# Patient Record
Sex: Female | Born: 1980 | Race: Black or African American | Hispanic: No | Marital: Single | Smoking: Current every day smoker
Health system: Southern US, Community
[De-identification: ages and names within clinical notes are randomized; demographics above are authoritative.]

---

## 2017-05-17 ENCOUNTER — Encounter: Payer: Self-pay | Admitting: Emergency Medicine

## 2017-05-17 ENCOUNTER — Other Ambulatory Visit: Payer: Self-pay

## 2017-05-17 ENCOUNTER — Emergency Department
Admission: EM | Admit: 2017-05-17 | Discharge: 2017-05-17 | Discharge status: Against Medical Advice | Payer: Self-pay | Attending: Emergency Medicine | Admitting: Emergency Medicine

## 2017-05-17 DIAGNOSIS — N898 Other specified noninflammatory disorders of vagina: Secondary | ICD-10-CM | POA: Insufficient documentation

## 2017-05-17 DIAGNOSIS — R350 Frequency of micturition: Secondary | ICD-10-CM | POA: Insufficient documentation

## 2017-05-17 DIAGNOSIS — Z5321 Procedure and treatment not carried out due to patient leaving prior to being seen by health care provider: Secondary | ICD-10-CM | POA: Insufficient documentation

## 2017-05-17 DIAGNOSIS — R1084 Generalized abdominal pain: Secondary | ICD-10-CM | POA: Insufficient documentation

## 2017-05-17 LAB — CBC
HCT: 35.5 % (ref 35.0–47.0)
HEMOGLOBIN: 11.2 g/dL — AB (ref 12.0–16.0)
MCH: 24.5 pg — ABNORMAL LOW (ref 26.0–34.0)
MCHC: 31.6 g/dL — ABNORMAL LOW (ref 32.0–36.0)
MCV: 77.6 fL — ABNORMAL LOW (ref 80.0–100.0)
PLATELETS: 280 10*3/uL (ref 150–440)
RBC: 4.58 MIL/uL (ref 3.80–5.20)
RDW: 17.2 % — ABNORMAL HIGH (ref 11.5–14.5)
WBC: 8.5 10*3/uL (ref 3.6–11.0)

## 2017-05-17 LAB — URINALYSIS, COMPLETE (UACMP) WITH MICROSCOPIC
Bacteria, UA: NONE SEEN
Bilirubin Urine: NEGATIVE
GLUCOSE, UA: NEGATIVE mg/dL
KETONES UR: NEGATIVE mg/dL
LEUKOCYTES UA: NEGATIVE
NITRITE: NEGATIVE
PH: 5 (ref 5.0–8.0)
Protein, ur: NEGATIVE mg/dL
Specific Gravity, Urine: 1.027 (ref 1.005–1.030)

## 2017-05-17 LAB — COMPREHENSIVE METABOLIC PANEL
ALK PHOS: 50 U/L (ref 38–126)
ALT: 18 U/L (ref 14–54)
AST: 33 U/L (ref 15–41)
Albumin: 3.9 g/dL (ref 3.5–5.0)
Anion gap: 5 (ref 5–15)
BUN: 12 mg/dL (ref 6–20)
CALCIUM: 8.8 mg/dL — AB (ref 8.9–10.3)
CO2: 21 mmol/L — ABNORMAL LOW (ref 22–32)
CREATININE: 0.73 mg/dL (ref 0.44–1.00)
Chloride: 109 mmol/L (ref 101–111)
Glucose, Bld: 90 mg/dL (ref 65–99)
Potassium: 4 mmol/L (ref 3.5–5.1)
Sodium: 135 mmol/L (ref 135–145)
TOTAL PROTEIN: 6.9 g/dL (ref 6.5–8.1)
Total Bilirubin: 0.5 mg/dL (ref 0.3–1.2)

## 2017-05-17 LAB — POC URINE PREG, ED: Preg Test, Ur: NEGATIVE

## 2017-05-17 LAB — LIPASE, BLOOD: Lipase: 25 U/L (ref 11–51)

## 2017-05-17 NOTE — ED Triage Notes (Signed)
Pt to ED c/o abd pain generalized sharp pain, urinary frequency, discharge, denies vaginal bleeding, denies n/v/d.

## 2017-05-17 NOTE — ED Triage Notes (Deleted)
Pt presents to ED with c/o high blood pressure. Pt states went to a dental clinic in [place] and had high blood pressure. Pt states had a history of high blood pressure a long time ago but changed his diet and problem resolved. 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 Pt c/o feeling anxious at this time.

## 2017-05-29 ENCOUNTER — Other Ambulatory Visit: Payer: Self-pay

## 2017-05-29 ENCOUNTER — Encounter: Payer: Self-pay | Admitting: Emergency Medicine

## 2017-05-29 ENCOUNTER — Emergency Department
Admission: EM | Admit: 2017-05-29 | Discharge: 2017-05-29 | Disposition: A | Discharge status: Home / Self Care | Payer: Self-pay | Attending: Emergency Medicine | Admitting: Emergency Medicine

## 2017-05-29 DIAGNOSIS — N3090 Cystitis, unspecified without hematuria: Secondary | ICD-10-CM | POA: Insufficient documentation

## 2017-05-29 DIAGNOSIS — F17210 Nicotine dependence, cigarettes, uncomplicated: Secondary | ICD-10-CM | POA: Insufficient documentation

## 2017-05-29 LAB — BASIC METABOLIC PANEL
Anion gap: 7 (ref 5–15)
BUN: 13 mg/dL (ref 6–20)
CALCIUM: 8.7 mg/dL — AB (ref 8.9–10.3)
CO2: 24 mmol/L (ref 22–32)
CREATININE: 0.63 mg/dL (ref 0.44–1.00)
Chloride: 105 mmol/L (ref 101–111)
GFR calc Af Amer: >60 mL/min (ref >60)
GFR calc non Af Amer: >60 mL/min (ref >60)
Glucose, Bld: 94 mg/dL (ref 65–99)
Potassium: 3.7 mmol/L (ref 3.5–5.1)
SODIUM: 136 mmol/L (ref 135–145)

## 2017-05-29 LAB — URINALYSIS, COMPLETE (UACMP) WITH MICROSCOPIC
BILIRUBIN URINE: NEGATIVE
Bacteria, UA: NONE SEEN
GLUCOSE, UA: NEGATIVE mg/dL
Hgb urine dipstick: NEGATIVE
Ketones, ur: NEGATIVE mg/dL
Nitrite: NEGATIVE
Protein, ur: NEGATIVE mg/dL
Specific Gravity, Urine: 1.021 (ref 1.005–1.030)
pH: 6 (ref 5.0–8.0)

## 2017-05-29 LAB — CBC WITH DIFFERENTIAL/PLATELET
BASOS PCT: 1 %
Basophils Absolute: 0 10*3/uL (ref 0–0.1)
EOS ABS: 0.2 10*3/uL (ref 0–0.7)
Eosinophils Relative: 3 %
HEMATOCRIT: 32 % — AB (ref 35.0–47.0)
Hemoglobin: 10.4 g/dL — ABNORMAL LOW (ref 12.0–16.0)
Lymphocytes Relative: 41 %
Lymphs Abs: 2.9 10*3/uL (ref 1.0–3.6)
MCH: 25.1 pg — ABNORMAL LOW (ref 26.0–34.0)
MCHC: 32.7 g/dL (ref 32.0–36.0)
MCV: 76.7 fL — ABNORMAL LOW (ref 80.0–100.0)
MONO ABS: 0.4 10*3/uL (ref 0.2–0.9)
MONOS PCT: 6 %
Neutro Abs: 3.5 10*3/uL (ref 1.4–6.5)
Neutrophils Relative %: 49 %
Platelets: 295 10*3/uL (ref 150–440)
RBC: 4.17 MIL/uL (ref 3.80–5.20)
RDW: 16.3 % — AB (ref 11.5–14.5)
WBC: 7.2 10*3/uL (ref 3.6–11.0)

## 2017-05-29 LAB — POCT PREGNANCY, URINE: Preg Test, Ur: NEGATIVE

## 2017-05-29 MED ORDER — CEPHALEXIN 500 MG PO CAPS: 500.0000 mg | ORAL_CAPSULE | Freq: Two times a day (BID) | ORAL | 0 refills | Status: AC

## 2017-05-29 MED ORDER — CEPHALEXIN 500 MG PO CAPS
500.0000 mg | ORAL_CAPSULE | Freq: Once | ORAL | Status: AC
  Administered 2017-05-29: 500 mg via ORAL
  Filled 2017-05-29: qty 1

## 2017-05-29 NOTE — ED Provider Notes (Signed)
[facility] Emergency Department Provider Note  ____________________________________________  Time seen: Approximately 9:00 PM  I have reviewed the triage vital signs and the nursing notes.   HISTORY  Chief Complaint Abdominal Pain    HPI Cheryl Pratt is a 37 y.o. female who complains of suprapubic abdominal pain that is sharp radiating to the lower back since yesterday. Associated with dysuria and urinary frequency. Denies any abnormal vaginal bleeding or discharge. No fevers chills vomiting diarrhea or constipation. Eating and drinking normally. No alleviating factors to the abdominal pain which is sharp.     History reviewed. No pertinent past medical history. PID  There are no active problems to display for this patient.    History reviewed. No pertinent surgical history.   Prior to Admission medications   Medication Sig Start Date End Date Taking? Authorizing Provider  cephALEXin (KEFLEX) 500 MG capsule Take 1 capsule (500 mg total) by mouth 2 (two) times daily. 05/29/17   Sharman CheekStafford, Peggie Hornak, MD     Allergies Patient has no known allergies.   No family history on file.  Social History Social History   Tobacco Use  . Smoking status: Current Every Day Smoker    Packs/day: 0.25    Types: Cigarettes  . Smokeless tobacco: Never Used  Substance Use Topics  . Alcohol use: Yes    Frequency: Never    Comment: couple times a month  . Drug use: No    Review of Systems  Constitutional:   No fever or chills.  ENT:   No sore throat. No rhinorrhea. Cardiovascular:   No chest pain or syncope. Respiratory:   No dyspnea or cough. Gastrointestinal:  Suprapubic abdominal pain as above..  Musculoskeletal:   Negative for focal pain or swelling All other systems reviewed and are negative except as documented above in ROS and HPI.  ____________________________________________   PHYSICAL EXAM:  VITAL SIGNS: ED Triage Vitals [05/29/17  1943]  Enc Vitals Group     BP 134/80     Pulse Rate 96     Resp 20     Temp 98.2 F (36.8 C)     Temp Source Oral     SpO2 100 %     Weight 200 lb (90.7 kg)     Height 5\' 5"  (1.651 m)     Head Circumference      Peak Flow      Pain Score 10     Pain Loc      Pain Edu?      Excl. in GC?     Vital signs reviewed, nursing assessments reviewed.   Constitutional:   Alert and oriented. Well appearing and in no distress. Eyes:   No scleral icterus.  EOMI. No nystagmus. No conjunctival pallor. PERRL. ENT   Head:   Normocephalic and atraumatic.   Nose:   No congestion/rhinnorhea.    Mouth/Throat:   MMM, no pharyngeal erythema. No peritonsillar mass.    Neck:   No meningismus. Full ROM. Hematological/Lymphatic/Immunilogical:   No cervical lymphadenopathy. Cardiovascular:   RRR. Symmetric bilateral radial and DP pulses.  No murmurs.  Respiratory:   Normal respiratory effort without tachypnea/retractions. Breath sounds are clear and equal bilaterally. No wheezes/rales/rhonchi. Gastrointestinal:   Soft with suprapubic tenderness. Non distended. There is no CVA tenderness.  No rebound, rigidity, or guarding. Genitourinary:   deferred Musculoskeletal:   Normal range of motion in all extremities. No joint effusions.  No lower extremity tenderness.  No edema. Neurologic:  Normal speech and language.  Motor grossly intact. No acute focal neurologic deficits are appreciated.  Skin:    Skin is warm, dry and intact. No rash noted.  No petechiae, purpura, or bullae.  ____________________________________________    LABS (pertinent positives/negatives) (all labs ordered are listed, but only abnormal results are displayed) Labs Reviewed  URINALYSIS, COMPLETE (UACMP) WITH MICROSCOPIC - Abnormal; Notable for the following components:      Result Value   Color, Urine YELLOW (*)    APPearance HAZY (*)    Leukocytes, UA TRACE (*)    Squamous Epithelial / LPF 6-30 (*)    All other  components within normal limits  CBC WITH DIFFERENTIAL/PLATELET - Abnormal; Notable for the following components:   Hemoglobin 10.4 (*)    HCT 32.0 (*)    MCV 76.7 (*)    MCH 25.1 (*)    RDW 16.3 (*)    All other components within normal limits  BASIC METABOLIC PANEL - Abnormal; Notable for the following components:   Calcium 8.7 (*)    All other components within normal limits  POCT PREGNANCY, URINE   ____________________________________________   EKG    ____________________________________________    RADIOLOGY  No results found.  ____________________________________________   PROCEDURES Procedures  ____________________________________________    CLINICAL IMPRESSION / ASSESSMENT AND PLAN / ED COURSE  Pertinent labs & imaging results that were available during my care of the patient were reviewed by me and considered in my medical decision making (see chart for details).   Patient well-appearing no acute distress, presents with normal vital signs and exam consistent with urinary tract infection. Labs all unremarkable except for urinalysis which is also consistent with urinary tract infection. I'll treat her with Keflex and have her follow up with primary care.Considering the patient's symptoms, medical history, and physical examination today, I have low suspicion for cholecystitis or biliary pathology, pancreatitis, perforation or bowel obstruction, hernia, intra-abdominal abscess, AAA or dissection, volvulus or intussusception, mesenteric ischemia, or appendicitis.        ____________________________________________   FINAL CLINICAL IMPRESSION(S) / ED DIAGNOSES    Final diagnoses:  Cystitis       Portions of this note were generated with dragon dictation software. Dictation errors may occur despite best attempts at proofreading.    Sharman Cheek, MD 05/29/17 2103

## 2017-05-29 NOTE — ED Triage Notes (Signed)
Patient ambulatory to triage with steady gait, without difficulty or distress noted; pt reports dysuria, lower abd & back pain since last wk; st was here last week but left before being seen due to long wait

## 2017-06-21 ENCOUNTER — Emergency Department
Admission: EM | Admit: 2017-06-21 | Discharge: 2017-06-21 | Disposition: A | Discharge status: Home / Self Care | Payer: Self-pay | Attending: Emergency Medicine | Admitting: Emergency Medicine

## 2017-06-21 ENCOUNTER — Other Ambulatory Visit: Payer: Self-pay

## 2017-06-21 ENCOUNTER — Encounter: Payer: Self-pay | Admitting: Emergency Medicine

## 2017-06-21 DIAGNOSIS — B9689 Other specified bacterial agents as the cause of diseases classified elsewhere: Secondary | ICD-10-CM

## 2017-06-21 DIAGNOSIS — N760 Acute vaginitis: Secondary | ICD-10-CM | POA: Insufficient documentation

## 2017-06-21 DIAGNOSIS — N3000 Acute cystitis without hematuria: Secondary | ICD-10-CM | POA: Insufficient documentation

## 2017-06-21 DIAGNOSIS — F17210 Nicotine dependence, cigarettes, uncomplicated: Secondary | ICD-10-CM | POA: Insufficient documentation

## 2017-06-21 LAB — CHLAMYDIA/NGC RT PCR (ARMC ONLY)
CHLAMYDIA TR: NOT DETECTED
N GONORRHOEAE: NOT DETECTED

## 2017-06-21 LAB — WET PREP, GENITAL
Sperm: NONE SEEN
TRICH WET PREP: NONE SEEN
Yeast Wet Prep HPF POC: NONE SEEN

## 2017-06-21 LAB — URINALYSIS, COMPLETE (UACMP) WITH MICROSCOPIC
Bilirubin Urine: NEGATIVE
GLUCOSE, UA: NEGATIVE mg/dL
Hgb urine dipstick: NEGATIVE
Ketones, ur: NEGATIVE mg/dL
Nitrite: NEGATIVE
PROTEIN: NEGATIVE mg/dL
Specific Gravity, Urine: 1.012 (ref 1.005–1.030)
pH: 6 (ref 5.0–8.0)

## 2017-06-21 LAB — POCT PREGNANCY, URINE: Preg Test, Ur: NEGATIVE

## 2017-06-21 MED ORDER — SULFAMETHOXAZOLE-TRIMETHOPRIM 800-160 MG PO TABS: 1.0000 | ORAL_TABLET | Freq: Two times a day (BID) | ORAL | 0 refills | Status: AC

## 2017-06-21 MED ORDER — METRONIDAZOLE 500 MG PO TABS: 500.0000 mg | ORAL_TABLET | Freq: Two times a day (BID) | ORAL | 0 refills | Status: AC

## 2017-06-21 NOTE — ED Triage Notes (Signed)
Presents with possible UTI  Dysuria and freq  Was treated for same about 3 weeks ago    sxs' returned over the past couple of days  Also having some vaginal itching

## 2017-06-21 NOTE — ED Notes (Signed)
First Nurse Note:  Patient states, "I think I have a UTI"  Patient is in no obvious distress at this time.

## 2017-06-21 NOTE — ED Provider Notes (Signed)
[facility] Emergency Department Provider Note  ____________________________________________  Time seen: Approximately 4:50 PM  I have reviewed the triage vital signs and the nursing notes.   HISTORY  Chief Complaint No chief complaint on file.    HPI Cheryl Pratt is a 37 y.o. female that presents to the emergency department for evaluation of dysuria and urgency for 1 week.  Patient was treated for a UTI 3 weeks ago.  Symptoms improved but then returned last week.  She is also having some suprapubic tenderness.  She was on her menstrual cycle so she did not come into the emergency department.  After getting off of her menstrual cycle, she has noticed some vaginal itching.  She denies fever, chills, nausea, vomiting.   History reviewed. No pertinent past medical history.  There are no active problems to display for this patient.   History reviewed. No pertinent surgical history.  Prior to Admission medications   Medication Sig Start Date End Date Taking? Authorizing Provider  cephALEXin (KEFLEX) 500 MG capsule Take 1 capsule (500 mg total) by mouth 2 (two) times daily. 05/29/17   Sharman CheekStafford, Phillip, MD  metroNIDAZOLE (FLAGYL) 500 MG tablet Take 1 tablet (500 mg total) by mouth 2 (two) times daily for 7 days. 06/21/17 06/28/17  Enid DerryWagner, Heaton Sarin, PA-C  sulfamethoxazole-trimethoprim (BACTRIM DS,SEPTRA DS) 800-160 MG tablet Take 1 tablet by mouth 2 (two) times daily. 06/21/17   Enid DerryWagner, Marcos Peloso, PA-C    Allergies Patient has no known allergies.  No family history on file.  Social History Social History   Tobacco Use  . Smoking status: Current Every Day Smoker    Packs/day: 0.25    Types: Cigarettes  . Smokeless tobacco: Never Used  Substance Use Topics  . Alcohol use: Yes    Frequency: Never    Comment: couple times a month  . Drug use: No     Review of Systems  Constitutional: No fever/chills Gastrointestinal: No nausea, no vomiting.   Genitourinary: Positive for dysuria. Musculoskeletal: Negative for musculoskeletal pain. Skin: Negative for rash, abrasions, lacerations, ecchymosis.   ____________________________________________   PHYSICAL EXAM:  VITAL SIGNS: ED Triage Vitals  Enc Vitals Group     BP 06/21/17 1541 113/75     Pulse Rate 06/21/17 1541 76     Resp 06/21/17 1541 20     Temp 06/21/17 1541 98 F (36.7 C)     Temp Source 06/21/17 1541 Oral     SpO2 06/21/17 1541 100 %     Weight 06/21/17 1536 200 lb (90.7 kg)     Height 06/21/17 1536 5\' 5"  (1.651 m)     Head Circumference --      Peak Flow --      Pain Score 06/21/17 1542 4     Pain Loc --      Pain Edu? --      Excl. in GC? --      Constitutional: Alert and oriented. Well appearing and in no acute distress. Eyes: Conjunctivae are normal. PERRL. EOMI. Head: Atraumatic. ENT:      Ears:      Nose: No congestion/rhinnorhea.      Mouth/Throat: Mucous membranes are moist.  Neck: No stridor.   Cardiovascular: Normal rate, regular rhythm.  Good peripheral circulation. Respiratory: Normal respiratory effort without tachypnea or retractions. Lungs CTAB. Good air entry to the bases with no decreased or absent breath sounds. Gastrointestinal: Bowel sounds 4 quadrants. Soft and nontender to palpation. No guarding or rigidity. No palpable  masses. No distention. No CVA tenderness. Musculoskeletal: Full range of motion to all extremities. No gross deformities appreciated. Genitourinary: No external rashes or lesions.  White vaginal discharge.  No cervical motion tenderness. Neurologic:  Normal speech and language. No gross focal neurologic deficits are appreciated.  Skin:  Skin is warm, dry and intact. No rash noted.   ____________________________________________   LABS (all labs ordered are listed, but only abnormal results are displayed)  Labs Reviewed  WET PREP, GENITAL - Abnormal; Notable for the following components:      Result Value    Clue Cells Wet Prep HPF POC PRESENT (*)    WBC, Wet Prep HPF POC MODERATE (*)    All other components within normal limits  URINALYSIS, COMPLETE (UACMP) WITH MICROSCOPIC - Abnormal; Notable for the following components:   Color, Urine YELLOW (*)    APPearance HAZY (*)    Leukocytes, UA MODERATE (*)    Bacteria, UA RARE (*)    Squamous Epithelial / LPF 6-30 (*)    All other components within normal limits  CHLAMYDIA/NGC RT PCR ([facility] ONLY)  URINE CULTURE  POC URINE PREG, ED  POCT PREGNANCY, URINE   ____________________________________________  EKG   ____________________________________________  RADIOLOGY   No results found.  ____________________________________________    PROCEDURES  Procedure(s) performed:    Procedures    Medications - No data to display   ____________________________________________   INITIAL IMPRESSION / ASSESSMENT AND PLAN / ED COURSE  Pertinent labs & imaging results that were available during my care of the patient were reviewed by me and considered in my medical decision making (see chart for details).  Review of the [place] CSRS was performed in accordance of the [facility] prior to dispensing any controlled drugs.   Patient's diagnosis is consistent with cystitis and BV.  Vital signs and exam are reassuring.  Education was provided.  Patient will be discharged home with prescriptions for Bactrim and Flagyl. Urine will be sent for culture. Patient is to follow up with PCP as directed. Patient is given ED precautions to return to the ED for any worsening or new symptoms.     ____________________________________________  FINAL CLINICAL IMPRESSION(S) / ED DIAGNOSES  Final diagnoses:  Bacterial vaginosis  Acute cystitis without hematuria      NEW MEDICATIONS STARTED DURING THIS VISIT:  ED Discharge Orders        Ordered    sulfamethoxazole-trimethoprim (BACTRIM DS,SEPTRA DS) 800-160 MG tablet  2 times daily     06/21/17  1807    metroNIDAZOLE (FLAGYL) 500 MG tablet  2 times daily     06/21/17 1807          This chart was dictated using voice recognition software/Dragon. Despite best efforts to proofread, errors can occur which can change the meaning. Any change was purely unintentional.    Enid Derry, PA-C 06/21/17 1823    Minna Antis, MD 06/21/17 [phone]

## 2017-06-24 LAB — URINE CULTURE

## 2017-06-25 NOTE — Progress Notes (Signed)
ED Antimicrobial Stewardship Positive Culture Follow Up   Cheryl Pratt is an 37 y.o. female who presented to [facility] on [facility]/21[facility]2017 with a chief complaint of No chief complaint on file.   Recent Results (from the past 720 hour(s))  Urine Culture     Status: Abnormal   Collection Time: 06/21/17  3:33 PM  Result Value Ref Range Status   Specimen Description   Final    URINE, RANDOM Performed at [facility], [address] [place], [place] [address]    Special Requests   Final    NONE Performed at [facility], [address] [place], [place] [address]    Culture >=100,000 COLONIES/mL ESCHERICHIA COLI (A)  Final   Report Status 06/24/2017 FINAL  Final   Organism ID, Bacteria ESCHERICHIA COLI (A)  Final      Susceptibility   Escherichia coli - MIC*    AMPICILLIN >=32 RESISTANT Resistant     CEFAZOLIN <=4 SENSITIVE Sensitive     CEFTRIAXONE <=1 SENSITIVE Sensitive     CIPROFLOXACIN <=0.25 SENSITIVE Sensitive     GENTAMICIN <=1 SENSITIVE Sensitive     IMIPENEM <=0.25 SENSITIVE Sensitive     NITROFURANTOIN <=16 SENSITIVE Sensitive     TRIMETH/SULFA >=320 RESISTANT Resistant     AMPICILLIN/SULBACTAM 16 INTERMEDIATE Intermediate     PIP/TAZO <=4 SENSITIVE Sensitive     Extended ESBL NEGATIVE Sensitive     * >=100,000 COLONIES/mL ESCHERICHIA COLI  Wet prep, genital     Status: Abnormal   Collection Time: 06/21/17  4:50 PM  Result Value Ref Range Status   Yeast Wet Prep HPF POC NONE SEEN NONE SEEN Final   Trich, Wet Prep NONE SEEN NONE SEEN Final   Clue Cells Wet Prep HPF POC PRESENT (A) NONE SEEN Final   WBC, Wet Prep HPF POC MODERATE (A) NONE SEEN Final   Sperm NONE SEEN  Final    Comment: Performed at [facility], [address] [place], [place] [address]  Chlamydia/NGC rt PCR [facility] only)     Status: None   Collection Time: 06/21/17  4:50 PM  Result Value Ref Range Status   Specimen source GC/Chlam URINE, RANDOM  Final   Chlamydia Tr NOT  DETECTED NOT DETECTED Final   N gonorrhoeae NOT DETECTED NOT DETECTED Final    Comment: (NOTE) 100  This methodology has not been evaluated in pregnant women or in 200  patients with a history of hysterectomy. 300 400  This methodology will not be performed on patients less than 4914  years of age. Performed at [facility], [address] [place], [place] [address]     [x]  Treated with bactrim, organism resistant to prescribed antimicrobial []  Patient discharged originally without antimicrobial agent and treatment is now indicated  New antibiotic prescription: macrobid 100mg  po bid x 7 days   ED Provider: Dr Marisa SeverinSiadecki  3/26@1530 ; Attempted to call patient multiple times, says vacant phone number. There was an email on file, emailed patient to please call pharmacist in ED as soon as possible.    Gerre PebblesGarrett Senica Crall 06/25/2017, 4:00 PM  Luan PullingGarrett Zophia Marrone, PharmD, MBA, [employer] Cla[employer]BCGP Clinical Pharmacist [facility]

## 2020-12-01 ENCOUNTER — Encounter: Payer: Self-pay | Admitting: *Deleted

## 2020-12-01 ENCOUNTER — Other Ambulatory Visit: Payer: Self-pay

## 2020-12-01 ENCOUNTER — Emergency Department: Payer: Medicaid - Out of State

## 2020-12-01 ENCOUNTER — Emergency Department
Admission: EM | Admit: 2020-12-01 | Discharge: 2020-12-01 | Disposition: A | Discharge status: Home / Self Care | Payer: Medicaid - Out of State | Attending: Emergency Medicine | Admitting: Emergency Medicine

## 2020-12-01 DIAGNOSIS — R0789 Other chest pain: Secondary | ICD-10-CM | POA: Insufficient documentation

## 2020-12-01 DIAGNOSIS — F17210 Nicotine dependence, cigarettes, uncomplicated: Secondary | ICD-10-CM | POA: Diagnosis not present

## 2020-12-01 LAB — CBC
HCT: 30.6 % — ABNORMAL LOW (ref 36.0–46.0)
Hemoglobin: 9.1 g/dL — ABNORMAL LOW (ref 12.0–15.0)
MCH: 22 pg — ABNORMAL LOW (ref 26.0–34.0)
MCHC: 29.7 g/dL — ABNORMAL LOW (ref 30.0–36.0)
MCV: 73.9 fL — ABNORMAL LOW (ref 80.0–100.0)
Platelets: 379 10*3/uL (ref 150–400)
RBC: 4.14 MIL/uL (ref 3.87–5.11)
RDW: 18 % — ABNORMAL HIGH (ref 11.5–15.5)
WBC: 7.3 10*3/uL (ref 4.0–10.5)
nRBC: 0 % (ref 0.0–0.2)

## 2020-12-01 LAB — BASIC METABOLIC PANEL
Anion gap: 4 — ABNORMAL LOW (ref 5–15)
BUN: 15 mg/dL (ref 6–20)
CO2: 26 mmol/L (ref 22–32)
Calcium: 8.6 mg/dL — ABNORMAL LOW (ref 8.9–10.3)
Chloride: 106 mmol/L (ref 98–111)
Creatinine, Ser: 0.74 mg/dL (ref 0.44–1.00)
GFR, Estimated: >60 mL/min (ref >60)
Glucose, Bld: 139 mg/dL — ABNORMAL HIGH (ref 70–99)
Potassium: 3.8 mmol/L (ref 3.5–5.1)
Sodium: 136 mmol/L (ref 135–145)

## 2020-12-01 LAB — TROPONIN I (HIGH SENSITIVITY)
Troponin I (High Sensitivity): 3 ng/L (ref <18)
Troponin I (High Sensitivity): 3 ng/L (ref <18)

## 2020-12-01 NOTE — Discharge Instructions (Addendum)
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
## 2020-12-01 NOTE — ED Notes (Signed)
Pt up to bathroom to obtain urine specimen

## 2020-12-01 NOTE — ED Triage Notes (Signed)
Pt reports central chest pain that goes through the right chest and right back area. Denies SOB.

## 2022-09-16 IMAGING — CR DG CHEST 2V
1 series · 2 of 2 positions shown · non-contrast
Comparison: None.

CLINICAL DATA: Chest pain.

EXAM:
CHEST - 2 VIEW

[Series 1: dg chest 2 view · 0.14mm/px · 2 of 2 slices shown]
[im 1/2]
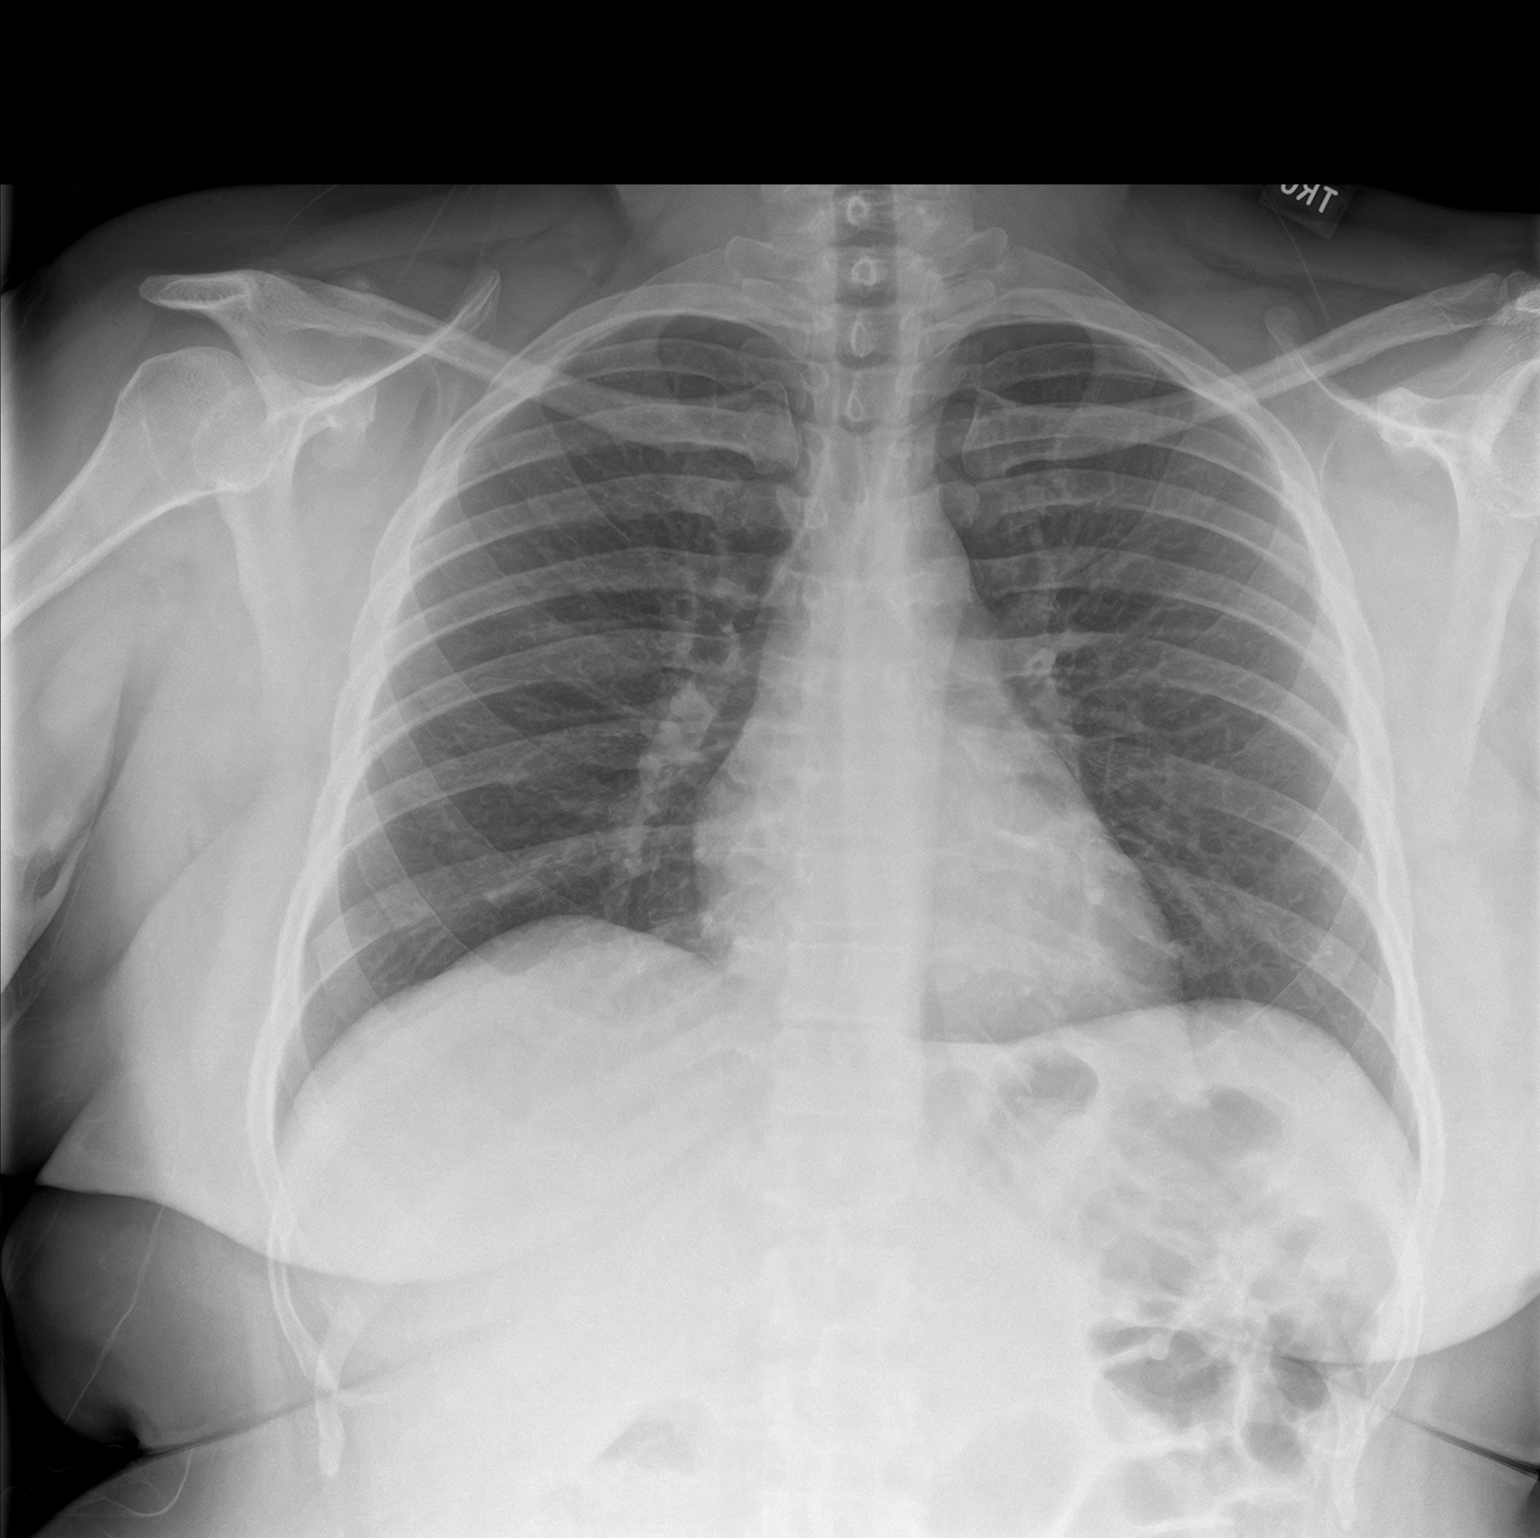
[im 2/2]
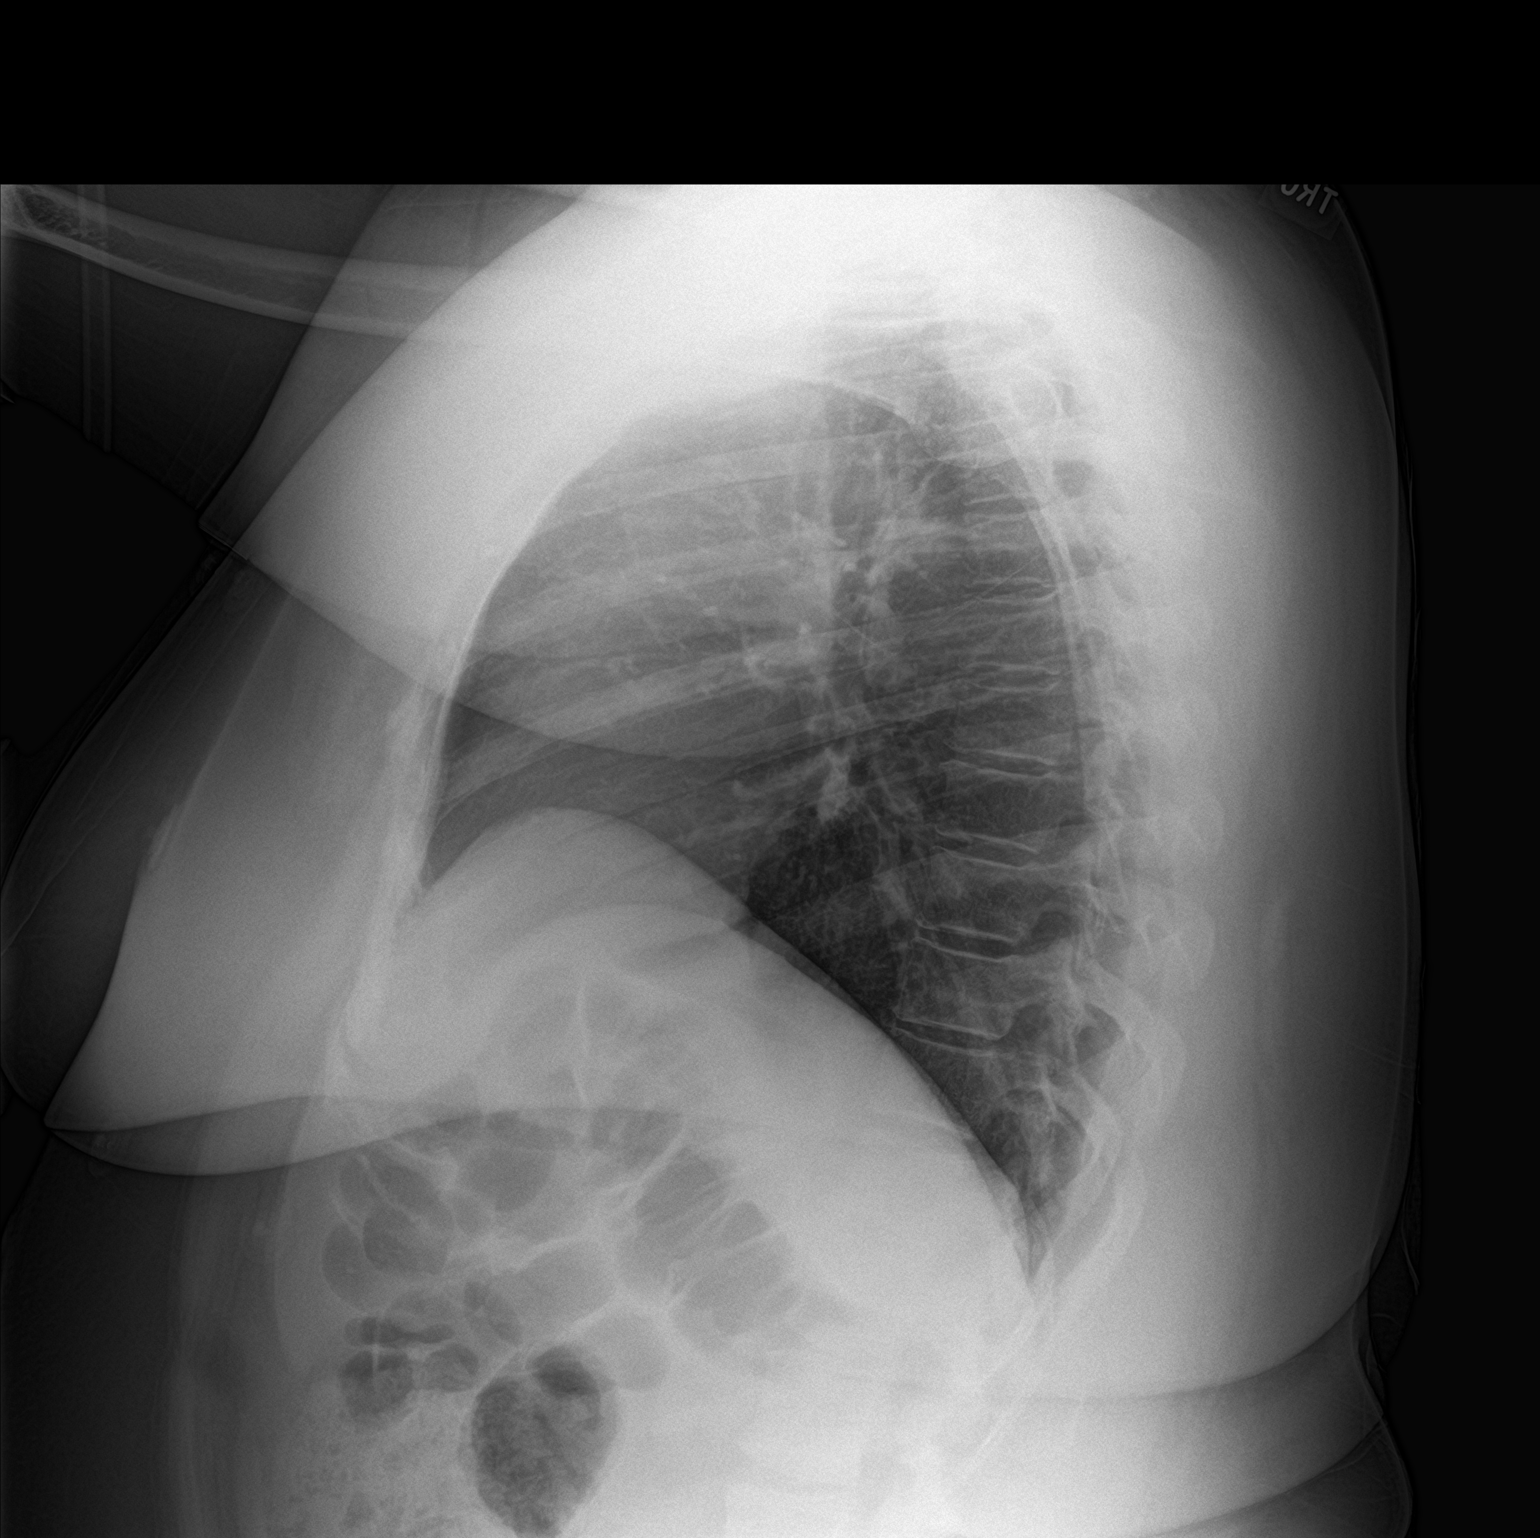

[2 of 2 positions shown; findings below may reference images not displayed]

FINDINGS: The heart size and mediastinal contours are within normal limits.
Both lungs are clear. The visualized skeletal structures are
unremarkable.
IMPRESSION: No active cardiopulmonary disease.
